# Patient Record
Sex: Female | Born: 1965 | Race: Black or African American | Hispanic: No | Marital: Married | Smoking: Never smoker
Health system: Southern US, Community
[De-identification: ages and names within clinical notes are randomized; demographics above are authoritative.]

## PROBLEM LIST (undated history)

## (undated) DIAGNOSIS — I10 Essential (primary) hypertension: Secondary | ICD-10-CM

## (undated) HISTORY — PX: BREAST EXCISIONAL BIOPSY: SUR124

---

## 1998-01-28 ENCOUNTER — Ambulatory Visit (HOSPITAL_COMMUNITY): Admission: RE | Admit: 1998-01-28 | Discharge: 1998-01-28 | Payer: Self-pay

## 1998-04-19 ENCOUNTER — Ambulatory Visit (HOSPITAL_COMMUNITY): Admission: RE | Admit: 1998-04-19 | Discharge: 1998-04-19 | Payer: Self-pay

## 1998-09-02 ENCOUNTER — Ambulatory Visit (HOSPITAL_COMMUNITY): Admission: RE | Admit: 1998-09-02 | Discharge: 1998-09-02 | Payer: Self-pay | Admitting: *Deleted

## 1999-07-29 ENCOUNTER — Other Ambulatory Visit: Admission: RE | Admit: 1999-07-29 | Discharge: 1999-07-29 | Payer: Self-pay | Admitting: Family Medicine

## 2001-01-03 ENCOUNTER — Other Ambulatory Visit: Admission: RE | Admit: 2001-01-03 | Discharge: 2001-01-03 | Payer: Self-pay | Admitting: Family Medicine

## 2002-01-03 ENCOUNTER — Inpatient Hospital Stay (HOSPITAL_COMMUNITY): Admission: AD | Admit: 2002-01-03 | Discharge: 2002-01-03 | Payer: Self-pay | Admitting: Obstetrics and Gynecology

## 2002-01-20 ENCOUNTER — Inpatient Hospital Stay (HOSPITAL_COMMUNITY): Admission: AD | Admit: 2002-01-20 | Discharge: 2002-01-23 | Payer: Self-pay | Admitting: Obstetrics and Gynecology

## 2002-01-24 ENCOUNTER — Encounter: Admission: RE | Admit: 2002-01-24 | Discharge: 2002-02-23 | Payer: Self-pay | Admitting: Obstetrics and Gynecology

## 2002-02-28 ENCOUNTER — Ambulatory Visit (HOSPITAL_COMMUNITY): Admission: RE | Admit: 2002-02-28 | Discharge: 2002-02-28 | Payer: Self-pay | Admitting: Obstetrics & Gynecology

## 2002-02-28 ENCOUNTER — Encounter (INDEPENDENT_AMBULATORY_CARE_PROVIDER_SITE_OTHER): Payer: Self-pay | Admitting: Specialist

## 2002-03-22 ENCOUNTER — Other Ambulatory Visit: Admission: RE | Admit: 2002-03-22 | Discharge: 2002-03-22 | Payer: Self-pay | Admitting: Obstetrics and Gynecology

## 2003-07-11 ENCOUNTER — Encounter: Admission: RE | Admit: 2003-07-11 | Discharge: 2003-07-11 | Payer: Self-pay | Admitting: Obstetrics and Gynecology

## 2005-09-15 ENCOUNTER — Encounter: Admission: RE | Admit: 2005-09-15 | Discharge: 2005-09-15 | Payer: Self-pay | Admitting: Obstetrics and Gynecology

## 2006-09-29 ENCOUNTER — Encounter: Admission: RE | Admit: 2006-09-29 | Discharge: 2006-09-29 | Payer: Self-pay | Admitting: Obstetrics and Gynecology

## 2007-10-19 ENCOUNTER — Encounter: Admission: RE | Admit: 2007-10-19 | Discharge: 2007-10-19 | Payer: Self-pay | Admitting: Obstetrics and Gynecology

## 2007-12-15 ENCOUNTER — Emergency Department (HOSPITAL_COMMUNITY): Admission: EM | Admit: 2007-12-15 | Discharge: 2007-12-15 | Payer: Self-pay | Admitting: Emergency Medicine

## 2008-11-13 ENCOUNTER — Encounter: Admission: RE | Admit: 2008-11-13 | Discharge: 2008-11-13 | Payer: Self-pay | Admitting: Obstetrics and Gynecology

## 2009-11-20 ENCOUNTER — Encounter: Admission: RE | Admit: 2009-11-20 | Discharge: 2009-11-20 | Payer: Self-pay | Admitting: Obstetrics and Gynecology

## 2010-07-28 ENCOUNTER — Encounter: Payer: Self-pay | Admitting: Obstetrics and Gynecology

## 2010-11-05 ENCOUNTER — Other Ambulatory Visit: Payer: Self-pay | Admitting: Obstetrics and Gynecology

## 2010-11-05 DIAGNOSIS — Z1231 Encounter for screening mammogram for malignant neoplasm of breast: Secondary | ICD-10-CM

## 2010-11-21 NOTE — H&P (Signed)
[facility]  Patient:    Joanne Reilly, Joanne Reilly Visit Number: [id number] MRN: [id number]          Service Type: OBS Location: 910B 9165 01 Attending Physician:  Lenoard Aden Dictated by:   Lenoard Aden, M.D. Admit Date:  01/20/2002                           History and Physical  CHIEF COMPLAINT:              Labor.  HISTORY OF PRESENT ILLNESS:   The patient is a 45 year old African American female G2, P0, EDD of February 02, 2002, at 38+ weeks with chronic hypertension in active labor.  ALLERGIES:                    No known drug allergies.  MEDICATIONS:                  Prenatal vitamins and Toprol XL.  PREGNANCY HISTORY:            Notable for ectopic pregnancy with questionable surgery to remove her tube.  History of fibroids per ultrasound.  History of IVF induced gestation.  History of breast surgery in 1999.  FAMILY HISTORY:               Hypertension.  PAST MEDICAL HISTORY:         Personal history of chronic hypertension.  PHYSICAL EXAMINATION: GENERAL:                      She is a well-developed, well-nourished [place] American female in no apparent distress.  HEENT:                        Normal.  LUNGS:                        Clear.  HEART:                        Regular rhythm.  ABDOMEN:                      Soft, gravid, nontender.  Estimated fetal weight by ultrasound is approximately 6-1/2 pounds.  PELVIC:                       Cervix is 6-7 cm, 100% vertex and 0 station.  EXTREMITIES:                  No cords.  NEUROLOGICAL:                 Exam is nonfocal.  IMPRESSION:                   1. Term intrauterine pregnancy with                                  chronic hypertension in active labor.                               2. Chronic hypertension, stable on Toprol XL.  3. Active labor.  PLAN:                         Proceed with attempted vaginal delivery.  Risks of anesthesia,  infection, bleeding, possible need for C-section discussed. Anticipate attempt at vaginal delivery. Dictated by:   Lenoard Aden, M.D. Attending Physician:  Lenoard Aden DD:  01/21/02 TD:  01/21/02 Job: [id number]

## 2010-11-21 NOTE — Op Note (Signed)
NAME:  Joanne Reilly, Joanne Reilly                       ACCOUNT NO.:  [id number]   MEDICAL RECORD NO.:  [id number]                   PATIENT TYPE:  AMB   LOCATION:  DFTL                                 FACILITY:  [facility]   PHYSICIAN:  Genia Del, M.D.             DATE OF BIRTH:  03/11/1966   DATE OF PROCEDURE:  02/28/2002  DATE OF DISCHARGE:                                 OPERATIVE REPORT   PREOPERATIVE DIAGNOSIS:  Retained products of conception prolapsing in the  vagina, six weeks postpartum.   POSTOPERATIVE DIAGNOSES:  1. Retained products of conception prolapsing in the vagina, six weeks     postpartum.  2. Probable accretio.   PROCEDURE:  Manual extraction of products of conception and curettage of  intrauterine cavity.   SURGEON:  Genia Del, M.D.   ANESTHESIOLOGIST:  Belva Agee, M.D.   PROCEDURE:  Under general anesthesia with endotracheal intubation, the  patient is in lithotomy position.  She was prepped with Hibiclens on the  suprapubic, vulvar, and vaginal areas and draped as usual.  The patient was  seen the same day in the office for her postpartum visit.  She had an exam  revealing a prolapsing mass from the endocervix protruding in the vagina.  The differential was a fibroid tumor versus products of conception.  A  biopsy had been done which confirmed products of conception.  So now, under  general anesthesia, a bimanual exam is done revealing placental tissue which  is firmly adherent to the lower anterior uterine segment.  This is carefully  extracted manually and sent to pathology.  On the anterior right aspect the  uterine wall feels thinner so attention is given not to go too deep in those  areas.  We then proceed with curettage of the intrauterine cavity with a  large sharp curette which brings mild amounts of placental tissue.  The  curettage is done systematically on intrauterine surfaces.  Although the  sensation is not of a completely regular  cavity since no significant pieces  of placenta is felt to remain, we stopped the procedure at this point in  order to avoid trauma to the uterus or even to the bladder anteriorly.  Hemostasis is adequate and the uterus is well contracted.  Products of  conception correspond to about 5 x 10 cm.  The instruments are all removed.  Estimated blood loss was around 200 cc.  No complications occurred and the  patient was transferred to the recovery room in good status.  She received  Cefotan at induction and she will continue on Augmentin 500 mg twice a day  for 10 days at home.                                                [place]  Seymour Bars, M.D.    ML/MEDQ  D:  02/28/2002  T:  02/28/2002  Job:  [id number]

## 2010-11-21 NOTE — Op Note (Signed)
[facility]  Patient:    Joanne Reilly, Joanne Reilly Visit Number: [id number] MRN: [id number]          Service Type: OBS Location: 910A 9133 01 Attending Physician:  Lenoard Aden Dictated by:   Lenoard Aden, M.D. Proc. Date: 01/21/02 Admit Date:  01/20/2002 Discharge Date: 01/23/2002                             Operative Report  INDICATIONS:                  Maternal exhaustion, persistent fetal tachycardia.  The patient has been pushing x2-1/2 hours.  POSTOPERATIVE DIAGNOSES:      Maternal exhaustion, persistent fetal tachycardia.  The patient has been pushing x2-1/2 hours, occiput posterior presentation, nuchal cord x1, body cord x2.  PROCEDURE:                    The patient and husband are apprised of the risks, benefits of proceeding with operative vaginal delivery.  Risks and benefits of vacuum neonatal and maternal complications noted.  The patient and husband desire to proceed.  The Mityvac mushroom cup applied to fetal vertex OP +3 station for atraumatic delivery x2 pulls and one popoff over a second degree laceration.  A full-term living female Apgars 8 and 9.  Placenta delivered spontaneously intact.  Three vessel cord noted.  No vaginal or cervical lacerations noted.  Second degree midline perineal laceration repaired using a 3-0 Vicryl Rapide.  Good hemostasis noted.  Estimated blood loss 500 cc.  Mother and baby tolerate procedure well.  Recovery room in good condition.  Please note, fetus was bulb suctioned and nuchal and body cords were reduced prior to delivery of the fetal trunk. Dictated by:   Lenoard Aden, M.D. Attending Physician:  Lenoard Aden DD:  01/21/02 TD:  01/25/02 Job: [id number]

## 2010-11-21 NOTE — H&P (Signed)
NAME:  Joanne Reilly, Joanne Reilly                       ACCOUNT NO.:  [id number]   MEDICAL RECORD NO.:  [id number]                   PATIENT TYPE:  AMB   LOCATION:  DFTL                                 FACILITY:  [facility]   PHYSICIAN:  Genia Del, M.D.             DATE OF BIRTH:  Nov 18, 1965   DATE OF ADMISSION:  02/28/2002  DATE OF DISCHARGE:                                HISTORY & PHYSICAL   CHIEF COMPLAINT:  Vaginal bleeding.   HISTORY OF PRESENT ILLNESS:  This is a 45 year old gravida 2, para 1-0-1-1,  married black female, who presented on February 28, 2002, for postpartum  visit, status post a vacuum delivery by Dr. Billy Coast on January 21, 2002, and who  is now being admitted for removal of a uterine/cervical mass found on  examination today at her visit.  The patient had complained of ongoing  vaginal bleeding with intermittent increasing in flow with some clots.  She  had no cramping.  She is bottle feeding.  She planned no contraception.  She  had had no problems with the delivery or the placenta as far as she knows.  Her history is notable for chronic hypertension.  Her pregnancy had been,  other than the hypertension, uncomplicated.  She had had several ultrasounds  during the pregnancy.  She is noted to have uterine fibroids, but none was  documented by ultrasound during her pregnancy, and her placenta initially  was complete previa, but became anterior-posterolateral without any issues  during the pregnancy as well.   PAST MEDICAL HISTORY:  1. Chronic hypertension.  2. Thalassemia trait.   ALLERGIES:  No known drug allergies.   MEDICATIONS:  1. Toprol XL.  2. Vitamins.   PAST SURGICAL HISTORY:  1. Breast surgery in 1999.  2. Ectopic pregnancy.   OBSTETRICAL HISTORY:  1. Vacuum extraction, January 21, 2002.  2. Ectopic pregnancy with this subsequent pregnancy as a result of in vitro     fertilization.   GYNECOLOGICAL HISTORY:  1. Fibroid uterus.  2. Ectopic  pregnancy.   FAMILY HISTORY:  Positive for hypertension.   SOCIAL HISTORY:  Married, nonsmoker, one child.   REVIEW OF SYSTEMS:  The patient complains of peripheral edema since the  delivery of her child.  She had no problems with edema, prepregnancy, or  during the pregnancy.  All other systems except as noted in the history of  present illness are negative.   PHYSICAL EXAMINATION:  GENERAL:  Well-developed, well-nourished black female  in no acute distress.  VITAL SIGNS:  Blood pressure 138/86, weight is 223-1/2 pounds.  SKIN:  Shows no lesions.  HEENT:  Pale pink conjunctivae, anicteric sclerae, oropharynx negative.  HEART:  Regular rate and rhythm without murmur.  LUNGS:  Clear to auscultation.  BREASTS:  Pedunculated, soft, nontender, no palpable mass.  ABDOMEN:  Soft, nontender, no palpable mass.  PELVIC:  Vulva showed no lesions.  Vagina had mucoid, bloody discharge.  Cervix with a  visible mass of the os, which was about 1+ cm dilated.  On  traction that mass prolapsed partially, but was not able to be removed in  the office with increased bleeding noted.  Uterus was anteverted to axilla,  nontender adnexa, no palpable mass.  On digital exam you can go  circumferentially around this mass.  A portion of it was sent to pathology  for [facility].  EXTREMITIES:  Had 1+ ankle edema bilaterally.   LABORATORY DATA:  Her hemoglobin was 10.4, her white count is 5.1, platelets  were 271,000.  [facility] pathology subsequently showed retained placental tissue.  Ultrasound had a 4.1 cm hypoechoic mass with calcification in the cervical  canal, question prolapsing fibroid with retained products.  Her uterus had a  1.8 cm anterior fibroid, and the uterus itself measured 10.7 x 6.6 x 7.3.  Endometrial lining was 0.7 cm.   IMPRESSION:  1. Prolapsing mass of the cervix, possible retained remnants of tissue     versus prolapse of fibroid.  2. Chronic hypertension, postpartum state.   PLAN:  1.  [facility]  2. Routine admission labs.  3. Dr. Seymour Bars, who is the on call physician, will be planning a D&C removal     of this mass, possible hysteroscopy.  Risks were reviewed and consent     will be obtained through Dr. Seymour Bars.  The patient has to return for Pap,     given the fact that she is bleeding at this time.     Joanne Reilly, M.D.               Genia Del, M.D.    SAC/MEDQ  D:  02/28/2002  T:  02/28/2002  Job:  [id number]

## 2010-11-24 ENCOUNTER — Ambulatory Visit
Admission: RE | Admit: 2010-11-24 | Discharge: 2010-11-24 | Disposition: A | Discharge status: Home / Self Care | Payer: 59 | Source: Ambulatory Visit | Attending: Obstetrics and Gynecology | Admitting: Obstetrics and Gynecology

## 2010-11-24 DIAGNOSIS — Z1231 Encounter for screening mammogram for malignant neoplasm of breast: Secondary | ICD-10-CM

## 2011-11-10 ENCOUNTER — Other Ambulatory Visit: Payer: Self-pay | Admitting: Obstetrics and Gynecology

## 2011-11-10 DIAGNOSIS — Z1231 Encounter for screening mammogram for malignant neoplasm of breast: Secondary | ICD-10-CM

## 2011-12-02 ENCOUNTER — Ambulatory Visit
Admission: RE | Admit: 2011-12-02 | Discharge: 2011-12-02 | Disposition: A | Discharge status: Home / Self Care | Payer: 59 | Source: Ambulatory Visit | Attending: Obstetrics and Gynecology | Admitting: Obstetrics and Gynecology

## 2011-12-02 DIAGNOSIS — Z1231 Encounter for screening mammogram for malignant neoplasm of breast: Secondary | ICD-10-CM

## 2012-12-14 ENCOUNTER — Other Ambulatory Visit: Payer: Self-pay

## 2012-12-14 DIAGNOSIS — Z1231 Encounter for screening mammogram for malignant neoplasm of breast: Secondary | ICD-10-CM

## 2013-01-12 ENCOUNTER — Ambulatory Visit: Payer: 59

## 2013-01-26 ENCOUNTER — Ambulatory Visit
Admission: RE | Admit: 2013-01-26 | Discharge: 2013-01-26 | Disposition: A | Discharge status: Home / Self Care | Payer: 59 | Source: Ambulatory Visit

## 2013-01-26 DIAGNOSIS — Z1231 Encounter for screening mammogram for malignant neoplasm of breast: Secondary | ICD-10-CM

## 2013-01-30 ENCOUNTER — Other Ambulatory Visit: Payer: Self-pay | Admitting: Obstetrics and Gynecology

## 2013-01-30 DIAGNOSIS — R928 Other abnormal and inconclusive findings on diagnostic imaging of breast: Secondary | ICD-10-CM

## 2013-02-09 ENCOUNTER — Inpatient Hospital Stay: Admission: RE | Admit: 2013-02-09 | Payer: 59 | Source: Ambulatory Visit

## 2013-02-09 ENCOUNTER — Ambulatory Visit
Admission: RE | Admit: 2013-02-09 | Discharge: 2013-02-09 | Disposition: A | Discharge status: Home / Self Care | Payer: 59 | Source: Ambulatory Visit | Attending: Family Medicine | Admitting: Family Medicine

## 2013-02-09 ENCOUNTER — Other Ambulatory Visit: Payer: Self-pay | Admitting: Family Medicine

## 2013-02-09 DIAGNOSIS — R928 Other abnormal and inconclusive findings on diagnostic imaging of breast: Secondary | ICD-10-CM

## 2013-07-21 ENCOUNTER — Other Ambulatory Visit: Payer: Self-pay | Admitting: Family Medicine

## 2013-07-21 DIAGNOSIS — R921 Mammographic calcification found on diagnostic imaging of breast: Secondary | ICD-10-CM

## 2013-08-14 ENCOUNTER — Ambulatory Visit
Admission: RE | Admit: 2013-08-14 | Discharge: 2013-08-14 | Disposition: A | Discharge status: Home / Self Care | Payer: Self-pay | Source: Ambulatory Visit | Attending: Family Medicine | Admitting: Family Medicine

## 2013-08-14 DIAGNOSIS — R921 Mammographic calcification found on diagnostic imaging of breast: Secondary | ICD-10-CM

## 2014-02-23 ENCOUNTER — Other Ambulatory Visit: Payer: Self-pay | Admitting: Obstetrics and Gynecology

## 2014-02-23 DIAGNOSIS — R921 Mammographic calcification found on diagnostic imaging of breast: Secondary | ICD-10-CM

## 2014-03-01 ENCOUNTER — Encounter (INDEPENDENT_AMBULATORY_CARE_PROVIDER_SITE_OTHER): Payer: Self-pay

## 2014-03-01 ENCOUNTER — Ambulatory Visit
Admission: RE | Admit: 2014-03-01 | Discharge: 2014-03-01 | Disposition: A | Discharge status: Home / Self Care | Payer: 59 | Source: Ambulatory Visit | Attending: Obstetrics and Gynecology | Admitting: Obstetrics and Gynecology

## 2014-03-01 DIAGNOSIS — R921 Mammographic calcification found on diagnostic imaging of breast: Secondary | ICD-10-CM

## 2015-02-04 ENCOUNTER — Other Ambulatory Visit: Payer: Self-pay | Admitting: Obstetrics and Gynecology

## 2015-02-04 DIAGNOSIS — R921 Mammographic calcification found on diagnostic imaging of breast: Secondary | ICD-10-CM

## 2015-03-22 ENCOUNTER — Ambulatory Visit
Admission: RE | Admit: 2015-03-22 | Discharge: 2015-03-22 | Disposition: A | Discharge status: Home / Self Care | Payer: Commercial Managed Care - HMO | Source: Ambulatory Visit | Attending: Obstetrics and Gynecology | Admitting: Obstetrics and Gynecology

## 2015-03-22 DIAGNOSIS — R921 Mammographic calcification found on diagnostic imaging of breast: Secondary | ICD-10-CM

## 2016-05-13 ENCOUNTER — Other Ambulatory Visit: Payer: Self-pay | Admitting: Obstetrics and Gynecology

## 2016-05-13 DIAGNOSIS — Z1231 Encounter for screening mammogram for malignant neoplasm of breast: Secondary | ICD-10-CM

## 2016-05-14 ENCOUNTER — Ambulatory Visit: Payer: Commercial Managed Care - HMO

## 2016-05-19 ENCOUNTER — Ambulatory Visit
Admission: RE | Admit: 2016-05-19 | Discharge: 2016-05-19 | Disposition: A | Discharge status: Home / Self Care | Payer: Commercial Managed Care - HMO | Source: Ambulatory Visit | Attending: Obstetrics and Gynecology | Admitting: Obstetrics and Gynecology

## 2016-05-19 DIAGNOSIS — Z1231 Encounter for screening mammogram for malignant neoplasm of breast: Secondary | ICD-10-CM

## 2017-01-27 DIAGNOSIS — Z01419 Encounter for gynecological examination (general) (routine) without abnormal findings: Secondary | ICD-10-CM | POA: Diagnosis not present

## 2017-04-19 DIAGNOSIS — Z13220 Encounter for screening for lipoid disorders: Secondary | ICD-10-CM | POA: Diagnosis not present

## 2017-04-19 DIAGNOSIS — Z0000 Encounter for general adult medical examination without abnormal findings: Secondary | ICD-10-CM | POA: Diagnosis not present

## 2017-06-10 ENCOUNTER — Other Ambulatory Visit: Payer: Self-pay | Admitting: Obstetrics and Gynecology

## 2017-06-10 DIAGNOSIS — Z1231 Encounter for screening mammogram for malignant neoplasm of breast: Secondary | ICD-10-CM

## 2017-07-08 ENCOUNTER — Ambulatory Visit
Admission: RE | Admit: 2017-07-08 | Discharge: 2017-07-08 | Disposition: A | Discharge status: Home / Self Care | Payer: 59 | Source: Ambulatory Visit | Attending: Obstetrics and Gynecology | Admitting: Obstetrics and Gynecology

## 2017-07-08 DIAGNOSIS — Z1231 Encounter for screening mammogram for malignant neoplasm of breast: Secondary | ICD-10-CM

## 2018-03-21 DIAGNOSIS — H04123 Dry eye syndrome of bilateral lacrimal glands: Secondary | ICD-10-CM | POA: Diagnosis not present

## 2018-03-21 DIAGNOSIS — H40033 Anatomical narrow angle, bilateral: Secondary | ICD-10-CM | POA: Diagnosis not present

## 2018-07-05 ENCOUNTER — Other Ambulatory Visit: Payer: Self-pay | Admitting: Obstetrics and Gynecology

## 2018-07-05 DIAGNOSIS — Z1231 Encounter for screening mammogram for malignant neoplasm of breast: Secondary | ICD-10-CM

## 2018-08-04 ENCOUNTER — Ambulatory Visit
Admission: RE | Admit: 2018-08-04 | Discharge: 2018-08-04 | Disposition: A | Discharge status: Home / Self Care | Payer: 59 | Source: Ambulatory Visit | Attending: Obstetrics and Gynecology | Admitting: Obstetrics and Gynecology

## 2018-08-04 ENCOUNTER — Other Ambulatory Visit: Payer: Self-pay | Admitting: Obstetrics and Gynecology

## 2018-08-04 DIAGNOSIS — Z1231 Encounter for screening mammogram for malignant neoplasm of breast: Secondary | ICD-10-CM | POA: Diagnosis not present

## 2018-09-06 DIAGNOSIS — Z6832 Body mass index (BMI) 32.0-32.9, adult: Secondary | ICD-10-CM | POA: Diagnosis not present

## 2018-09-06 DIAGNOSIS — Z01419 Encounter for gynecological examination (general) (routine) without abnormal findings: Secondary | ICD-10-CM | POA: Diagnosis not present

## 2018-09-09 DIAGNOSIS — E669 Obesity, unspecified: Secondary | ICD-10-CM | POA: Diagnosis not present

## 2018-09-09 DIAGNOSIS — Z6832 Body mass index (BMI) 32.0-32.9, adult: Secondary | ICD-10-CM | POA: Diagnosis not present

## 2018-09-14 DIAGNOSIS — Z0000 Encounter for general adult medical examination without abnormal findings: Secondary | ICD-10-CM | POA: Diagnosis not present

## 2018-09-14 DIAGNOSIS — Z13220 Encounter for screening for lipoid disorders: Secondary | ICD-10-CM | POA: Diagnosis not present

## 2018-10-17 DIAGNOSIS — Z6829 Body mass index (BMI) 29.0-29.9, adult: Secondary | ICD-10-CM | POA: Diagnosis not present

## 2018-10-17 DIAGNOSIS — E669 Obesity, unspecified: Secondary | ICD-10-CM | POA: Diagnosis not present

## 2019-05-15 ENCOUNTER — Other Ambulatory Visit: Payer: Self-pay

## 2019-05-15 DIAGNOSIS — Z20822 Contact with and (suspected) exposure to covid-19: Secondary | ICD-10-CM

## 2019-05-17 LAB — NOVEL CORONAVIRUS, NAA
SARS-CoV-2, NAA: NOT DETECTED
SARS-CoV-2, NAA: NOT DETECTED

## 2019-08-16 ENCOUNTER — Other Ambulatory Visit: Payer: Self-pay | Admitting: Obstetrics and Gynecology

## 2019-08-16 DIAGNOSIS — Z1231 Encounter for screening mammogram for malignant neoplasm of breast: Secondary | ICD-10-CM

## 2019-08-22 ENCOUNTER — Other Ambulatory Visit: Payer: Self-pay

## 2019-08-22 ENCOUNTER — Ambulatory Visit
Admission: RE | Admit: 2019-08-22 | Discharge: 2019-08-22 | Disposition: A | Discharge status: Home / Self Care | Payer: 59 | Source: Ambulatory Visit | Attending: Obstetrics and Gynecology | Admitting: Obstetrics and Gynecology

## 2019-08-22 DIAGNOSIS — Z1231 Encounter for screening mammogram for malignant neoplasm of breast: Secondary | ICD-10-CM

## 2020-08-07 ENCOUNTER — Other Ambulatory Visit: Payer: Self-pay | Admitting: Obstetrics and Gynecology

## 2020-08-07 DIAGNOSIS — Z1231 Encounter for screening mammogram for malignant neoplasm of breast: Secondary | ICD-10-CM

## 2020-09-06 ENCOUNTER — Inpatient Hospital Stay: Admission: RE | Admit: 2020-09-06 | Payer: 59 | Source: Ambulatory Visit

## 2020-09-06 DIAGNOSIS — Z1231 Encounter for screening mammogram for malignant neoplasm of breast: Secondary | ICD-10-CM

## 2020-09-16 ENCOUNTER — Other Ambulatory Visit: Payer: Self-pay | Admitting: Obstetrics and Gynecology

## 2020-09-16 DIAGNOSIS — Z1231 Encounter for screening mammogram for malignant neoplasm of breast: Secondary | ICD-10-CM

## 2020-11-01 ENCOUNTER — Other Ambulatory Visit: Payer: Self-pay

## 2020-11-01 ENCOUNTER — Ambulatory Visit
Admission: RE | Admit: 2020-11-01 | Discharge: 2020-11-01 | Disposition: A | Discharge status: Home / Self Care | Payer: 59 | Source: Ambulatory Visit

## 2020-11-01 DIAGNOSIS — Z1231 Encounter for screening mammogram for malignant neoplasm of breast: Secondary | ICD-10-CM

## 2021-01-15 ENCOUNTER — Other Ambulatory Visit: Payer: Self-pay

## 2021-01-15 ENCOUNTER — Ambulatory Visit: Payer: 59 | Admitting: Podiatry

## 2021-01-15 ENCOUNTER — Ambulatory Visit (INDEPENDENT_AMBULATORY_CARE_PROVIDER_SITE_OTHER): Payer: 59

## 2021-01-15 DIAGNOSIS — M79672 Pain in left foot: Secondary | ICD-10-CM

## 2021-01-15 DIAGNOSIS — M722 Plantar fascial fibromatosis: Secondary | ICD-10-CM | POA: Diagnosis not present

## 2021-01-15 DIAGNOSIS — M79671 Pain in right foot: Secondary | ICD-10-CM | POA: Diagnosis not present

## 2021-01-15 MED ORDER — BETAMETHASONE SOD PHOS & ACET 6 (3-3) MG/ML IJ SUSP
3.0000 mg | Freq: Once | INTRAMUSCULAR | Status: AC
  Administered 2021-01-15: 3 mg via INTRA_ARTICULAR

## 2021-01-15 MED ORDER — MELOXICAM 15 MG PO TABS: 15.0000 mg | ORAL_TABLET | Freq: Every day | ORAL | 1 refills | Status: DC

## 2021-01-15 NOTE — Progress Notes (Signed)
   Subjective: 55 y.o. female presenting as a new patient for evaluation of bilateral heel pain is been going on for approximately 1-2 years now.  She continues to have pain and tenderness in her heels.  She denies a history of injury.  She has not anything for treatment.  She presents for further treatment and evaluation   No past medical history on file.   Objective: Physical Exam General: The patient is alert and oriented x3 in no acute distress.  Dermatology: Skin is warm, dry and supple bilateral lower extremities. Negative for open lesions or macerations bilateral.   Vascular: Dorsalis Pedis and Posterior Tibial pulses palpable bilateral.  Capillary fill time is immediate to all digits.  Neurological: Epicritic and protective threshold intact bilateral.   Musculoskeletal: Tenderness to palpation to the plantar aspect of the bilateral heels along the plantar fascia. All other joints range of motion within normal limits bilateral. Strength 5/5 in all groups bilateral.   Radiographic exam: Normal osseous mineralization. Joint spaces preserved. No fracture/dislocation/boney destruction. No other soft tissue abnormalities or radiopaque foreign bodies.   Assessment: 1. plantar fasciitis bilateral feet  Plan of Care:  1. Patient evaluated. Xrays reviewed.   2. Injection of 0.5cc Celestone soluspan injected into the bilateral heels.  3. Patient declined medrol dosepak 4. Rx for Meloxicam ordered for patient. 5. OTC powerstep insoles provided 6. Instructed patient regarding therapies and modalities at home to alleviate symptoms.  7. Return to clinic in 4 weeks.    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 Felecia Shelling, DPM    [address] [place], [place] [address]                Office [phone]  Fax [phone]

## 2021-02-12 ENCOUNTER — Ambulatory Visit: Payer: 59 | Admitting: Podiatry

## 2021-03-08 ENCOUNTER — Other Ambulatory Visit: Payer: Self-pay | Admitting: Podiatry

## 2021-03-10 NOTE — Telephone Encounter (Signed)
Please advise 

## 2021-05-13 ENCOUNTER — Other Ambulatory Visit: Payer: Self-pay | Admitting: Podiatry

## 2021-07-15 ENCOUNTER — Other Ambulatory Visit: Payer: Self-pay | Admitting: Podiatry

## 2021-09-03 ENCOUNTER — Other Ambulatory Visit: Payer: Self-pay | Admitting: Podiatry

## 2021-09-19 ENCOUNTER — Other Ambulatory Visit: Payer: Self-pay | Admitting: Obstetrics and Gynecology

## 2021-09-19 DIAGNOSIS — Z1231 Encounter for screening mammogram for malignant neoplasm of breast: Secondary | ICD-10-CM

## 2021-10-14 DIAGNOSIS — Z1231 Encounter for screening mammogram for malignant neoplasm of breast: Secondary | ICD-10-CM

## 2021-11-04 ENCOUNTER — Other Ambulatory Visit: Payer: Self-pay | Admitting: Obstetrics and Gynecology

## 2021-11-04 DIAGNOSIS — Z1231 Encounter for screening mammogram for malignant neoplasm of breast: Secondary | ICD-10-CM

## 2021-11-10 ENCOUNTER — Ambulatory Visit
Admission: RE | Admit: 2021-11-10 | Discharge: 2021-11-10 | Disposition: A | Discharge status: Home / Self Care | Payer: 59 | Source: Ambulatory Visit

## 2021-11-10 DIAGNOSIS — Z1231 Encounter for screening mammogram for malignant neoplasm of breast: Secondary | ICD-10-CM

## 2021-11-26 ENCOUNTER — Other Ambulatory Visit: Payer: Self-pay | Admitting: Podiatry

## 2022-01-22 ENCOUNTER — Other Ambulatory Visit: Payer: Self-pay | Admitting: Podiatry

## 2022-08-02 IMAGING — MG MM DIGITAL SCREENING BILAT W/ TOMO AND CAD
6 of 10 series · 6 of 30 positions shown · non-contrast
Comparison: Previous exam(s).

CLINICAL DATA: Screening.

EXAM:
DIGITAL SCREENING BILATERAL MAMMOGRAM WITH TOMOSYNTHESIS AND CAD
TECHNIQUE: Bilateral screening digital craniocaudal and mediolateral oblique
mammograms were obtained. Bilateral screening digital breast
tomosynthesis was performed. The images were evaluated with
computer-aided detection.

[L CC synth-2D]
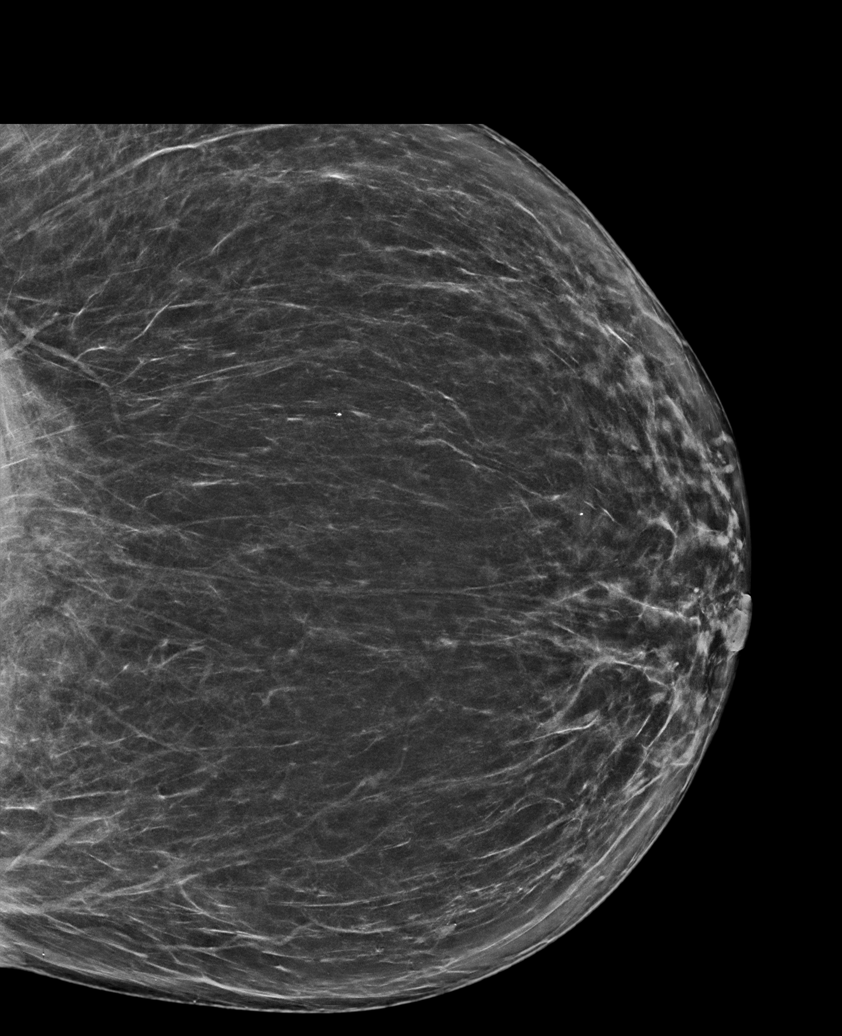

[L MLO synth-2D]
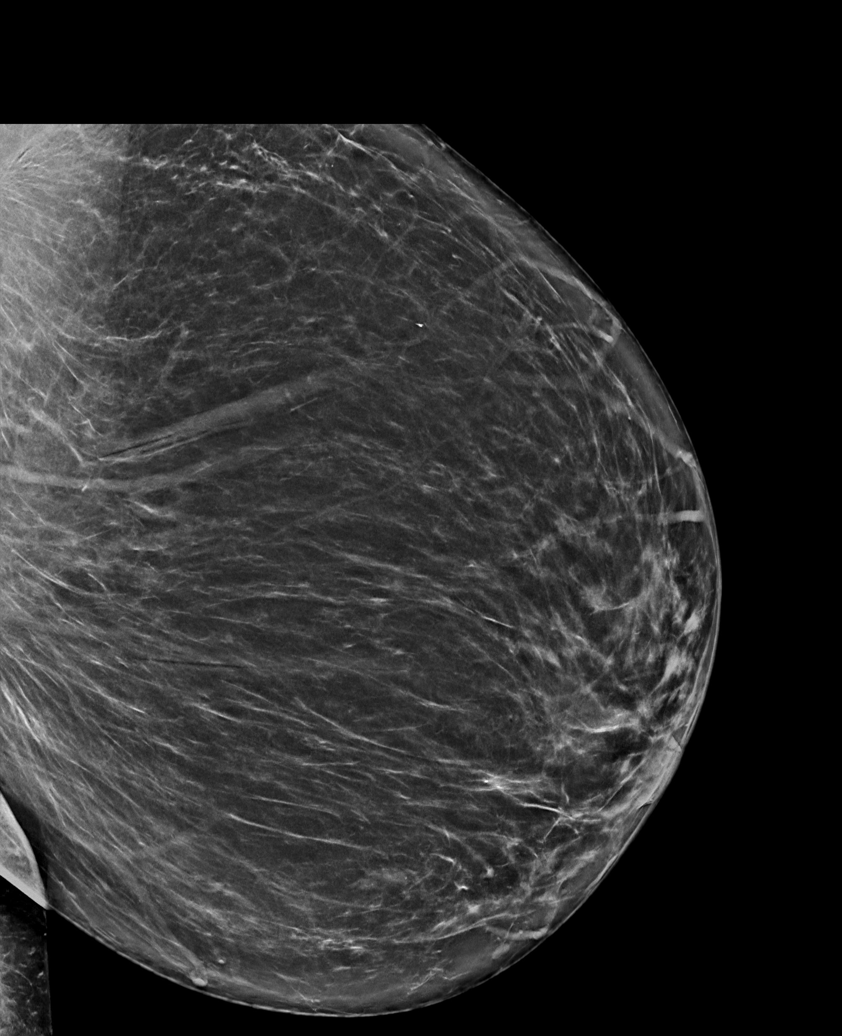

[R CC synth-2D (1 of 2)]
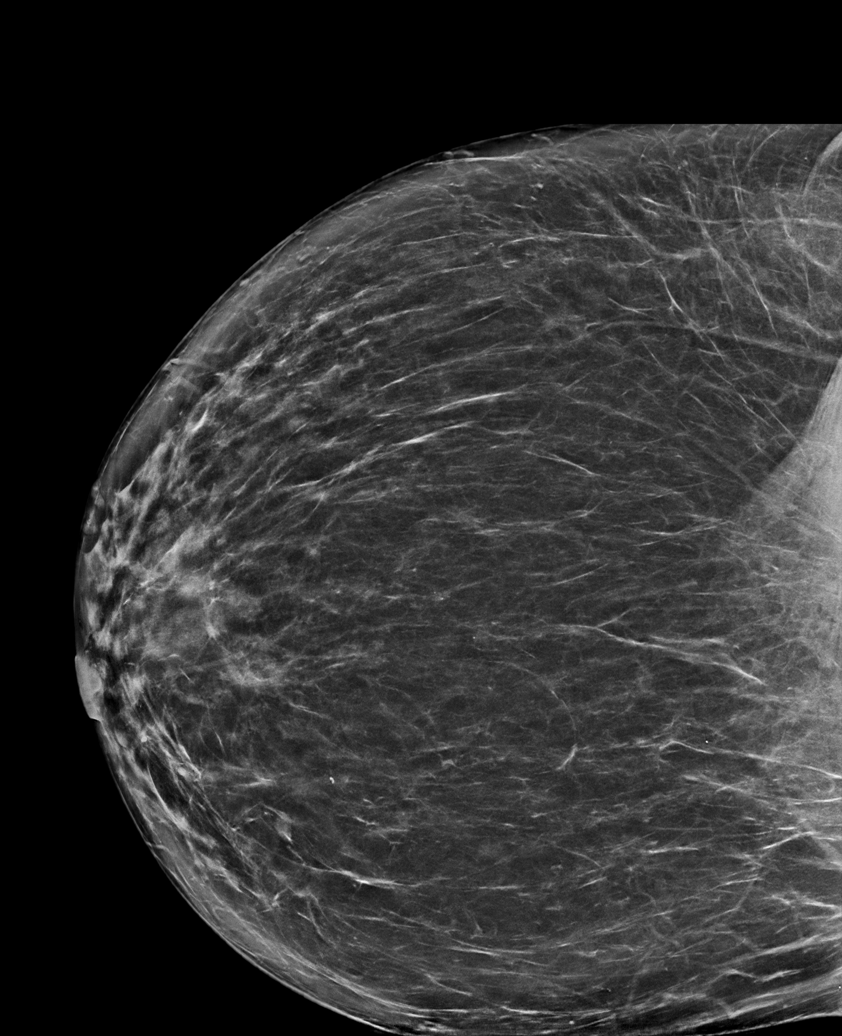

[R CC synth-2D (2 of 2)]
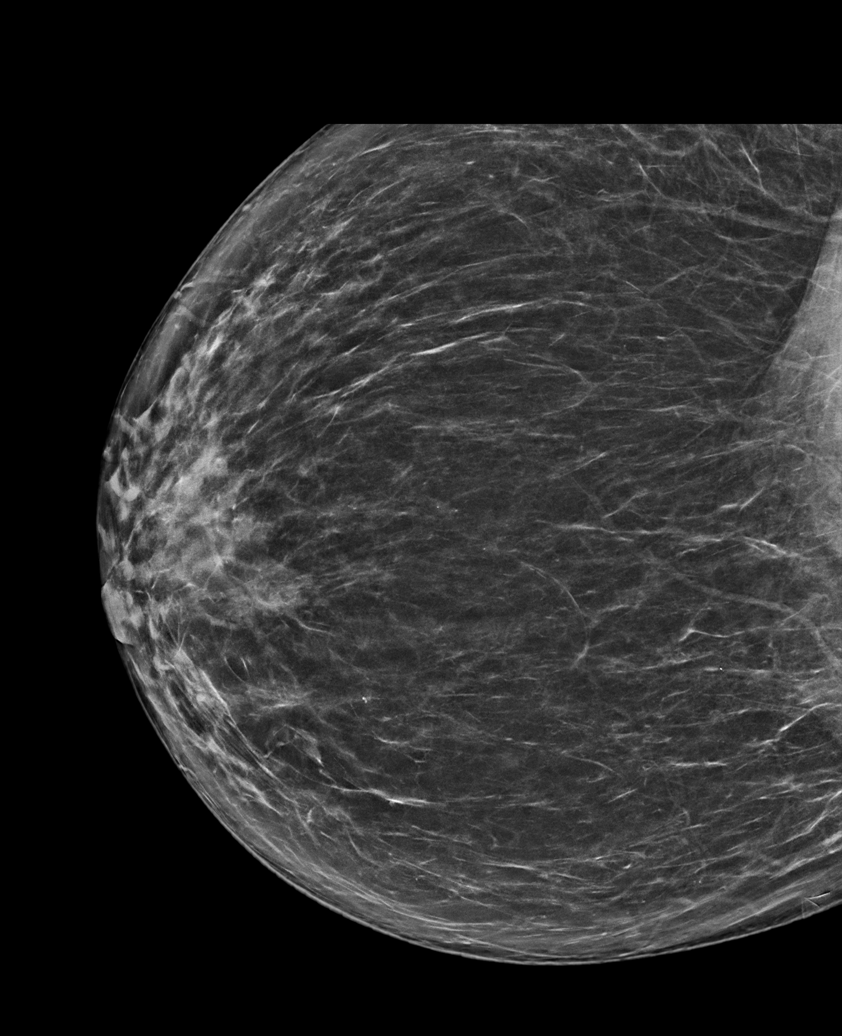

[R MLO synth-2D]
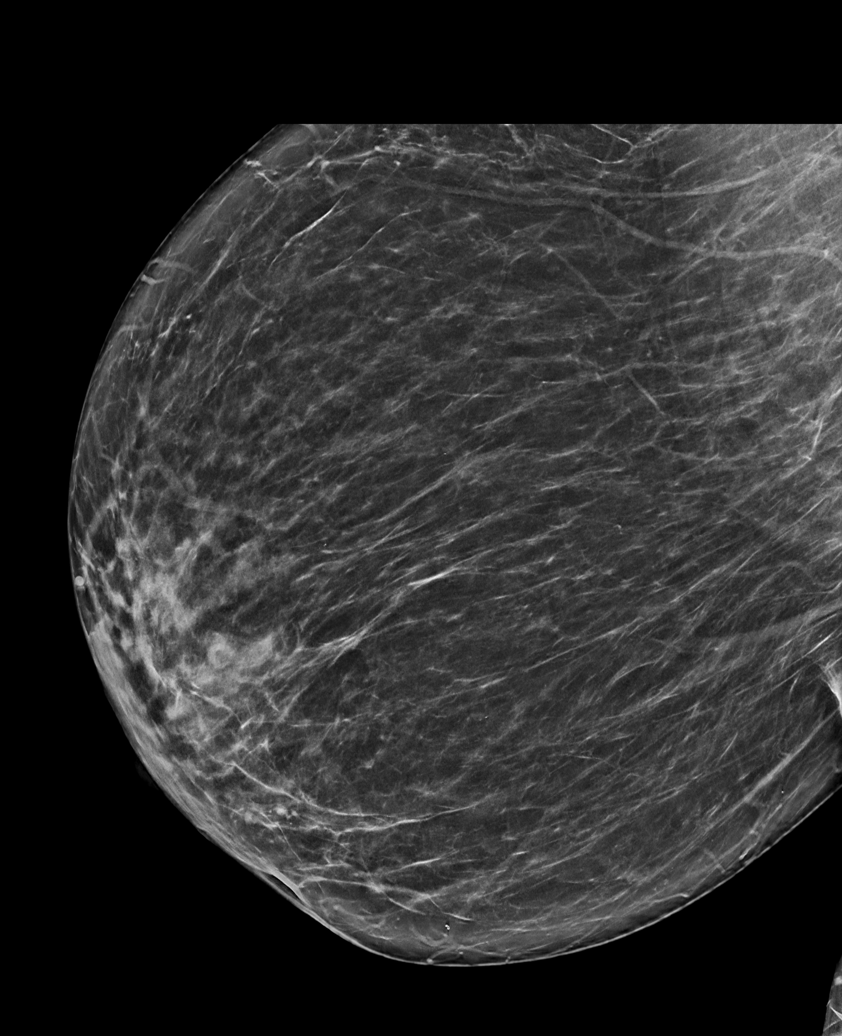

[R CC tomo · tomo slice 43/86.0]
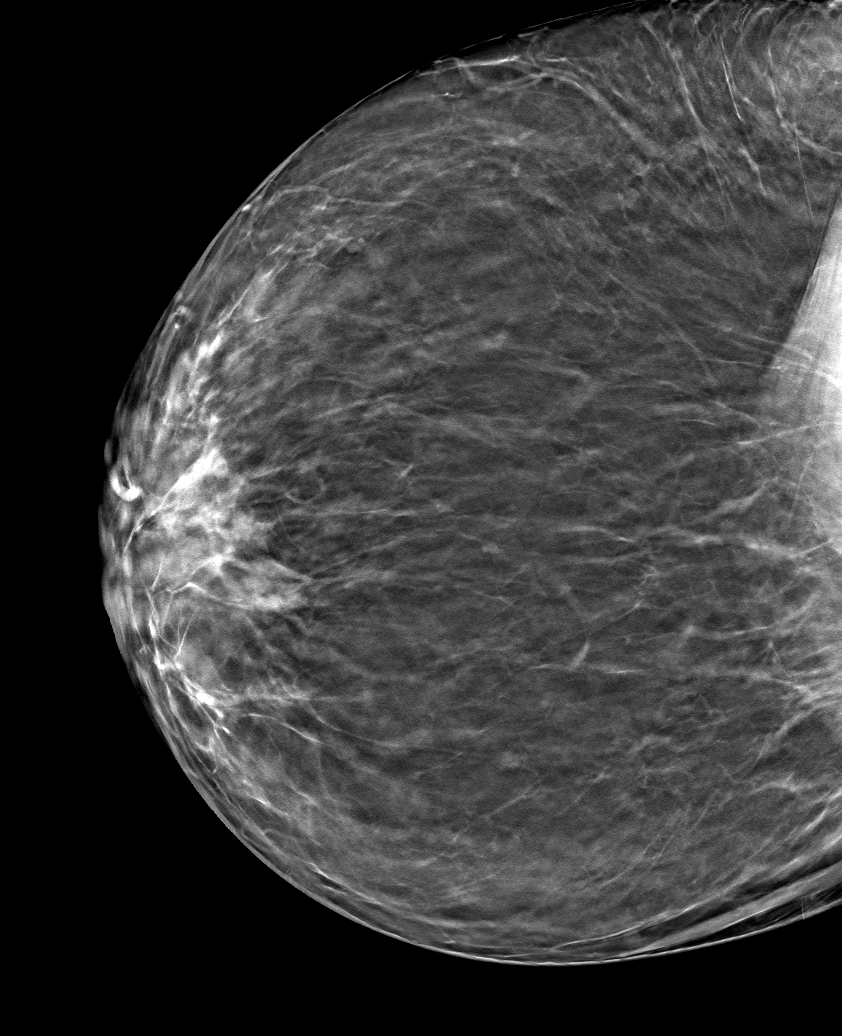

[6 of 30 positions shown; findings below may reference images not displayed]

ACR Breast Density Category b: There are scattered areas of
fibroglandular density.
FINDINGS: There are no findings suspicious for malignancy.
IMPRESSION: No mammographic evidence of malignancy. A result letter of this
screening mammogram will be mailed directly to the patient.

RECOMMENDATION:
Screening mammogram in one year. (Code:51-O-LD2)

BI-RADS CATEGORY  1: Negative.

## 2022-10-16 ENCOUNTER — Ambulatory Visit
Admission: RE | Admit: 2022-10-16 | Discharge: 2022-10-16 | Disposition: A | Discharge status: Home / Self Care | Payer: 59 | Source: Ambulatory Visit

## 2022-10-16 VITALS — BP 127/88 | HR 84 | Temp 98.0°F | Resp 18

## 2022-10-16 DIAGNOSIS — L2082 Flexural eczema: Secondary | ICD-10-CM

## 2022-10-16 HISTORY — DX: Essential (primary) hypertension: I10

## 2022-10-16 MED ORDER — BETAMETHASONE DIPROPIONATE 0.05 % EX OINT: TOPICAL_OINTMENT | Freq: Two times a day (BID) | CUTANEOUS | 1 refills | Status: AC

## 2022-10-16 NOTE — ED Triage Notes (Signed)
Pt c/o burning pain and discoloration to posterior lower leg x ~2 years-states she was seen PCP and dermatology x 2 with biopsies taken-dx eczema-NAD-steady gait

## 2022-10-16 NOTE — ED Provider Notes (Signed)
[facility]  Note:  This document was prepared using Conservation officer, historic buildings and may include unintentional dictation errors.  MRN: [id number] DOB: 1965-11-25  Subjective:   Joanne Reilly is a 57 y.o. female presenting for 4 month history of worsening rash to the back of her right leg/knee.  Symptoms have gone on for 2 years.  She did see a dermatologist and had multiple biopsies taken, it was confirmed to have eczema.  She was prescribed a steroid cream but has not really used it much.  No active redness, drainage of pus or bleeding.  Patient does get intermittent stinging sensations and redness but mostly has dry dark-colored skin the skin over the area.  No current facility-administered medications for this encounter.  Current Outpatient Medications:    losartan-hydrochlorothiazide (HYZAAR) 100-25 MG tablet, Take 1 tablet by mouth daily., Disp: , Rfl:    meloxicam (MOBIC) 15 MG tablet, TAKE 1 TABLET (15 MG TOTAL) BY MOUTH DAILY., Disp: 30 tablet, Rfl: 1   No Known Allergies  Past Medical History:  Diagnosis Date   Hypertension      Past Surgical History:  Procedure Laterality Date   BREAST EXCISIONAL BIOPSY Left    BREAST EXCISIONAL BIOPSY Right     Family History  Problem Relation Age of Onset   Breast cancer Neg Hx     Social History   Tobacco Use   Smoking status: Never   Smokeless tobacco: Never  Vaping Use   Vaping Use: Never used  Substance Use Topics   Alcohol use: Not Currently   Drug use: Never    ROS   Objective:   Vitals: BP 127/88 (BP Location: Right Arm)   Pulse 84   Temp 98 F (36.7 C) (Oral)   Resp 18   LMP 01/16/2013   SpO2 97%   Physical Exam Constitutional:      General: She is not in acute distress.    Appearance: Normal appearance. She is well-developed. She is not ill-appearing, toxic-appearing or diaphoretic.  HENT:     Head: Normocephalic and atraumatic.     Nose: Nose normal.      Mouth/Throat:     Mouth: Mucous membranes are moist.  Eyes:     General: No scleral icterus.       Right eye: No discharge.        Left eye: No discharge.     Extraocular Movements: Extraocular movements intact.  Cardiovascular:     Rate and Rhythm: Normal rate.  Pulmonary:     Effort: Pulmonary effort is normal.  Musculoskeletal:     Lumbar back: No swelling, edema, deformity, signs of trauma, lacerations, spasms, tenderness or bony tenderness. Normal range of motion. Negative right straight leg raise test and negative left straight leg raise test. No scoliosis.  Skin:    General: Skin is warm and dry.       Neurological:     General: No focal deficit present.     Mental Status: She is alert and oriented to person, place, and time.  Psychiatric:        Mood and Affect: Mood normal.        Behavior: Behavior normal.     Assessment and Plan :   PDMP not reviewed this encounter.  1. Flexural eczema     Patient declined an oral steroid course and I am in agreement.  Recommended the use of betamethasone ointment.  Discussed appropriate use of topical steroids.  Follow-up with dermatology. Counseled patient on potential for adverse effects with medications prescribed/recommended today, ER and return-to-clinic precautions discussed, patient verbalized understanding.    Wallis Bamberg, PA-C 10/16/22 1404

## 2022-10-27 ENCOUNTER — Other Ambulatory Visit: Payer: Self-pay | Admitting: Obstetrics and Gynecology

## 2022-10-27 DIAGNOSIS — Z1231 Encounter for screening mammogram for malignant neoplasm of breast: Secondary | ICD-10-CM

## 2022-11-18 ENCOUNTER — Ambulatory Visit
Admission: RE | Admit: 2022-11-18 | Discharge: 2022-11-18 | Disposition: A | Discharge status: Home / Self Care | Payer: 59 | Source: Ambulatory Visit

## 2022-11-18 DIAGNOSIS — Z1231 Encounter for screening mammogram for malignant neoplasm of breast: Secondary | ICD-10-CM

## 2022-12-21 ENCOUNTER — Ambulatory Visit (INDEPENDENT_AMBULATORY_CARE_PROVIDER_SITE_OTHER): Payer: 59 | Admitting: Podiatry

## 2022-12-21 ENCOUNTER — Ambulatory Visit (INDEPENDENT_AMBULATORY_CARE_PROVIDER_SITE_OTHER): Payer: 59

## 2022-12-21 ENCOUNTER — Encounter: Payer: Self-pay | Admitting: Podiatry

## 2022-12-21 DIAGNOSIS — M2011 Hallux valgus (acquired), right foot: Secondary | ICD-10-CM | POA: Diagnosis not present

## 2022-12-21 DIAGNOSIS — M2012 Hallux valgus (acquired), left foot: Secondary | ICD-10-CM

## 2022-12-21 NOTE — Progress Notes (Signed)
   Chief Complaint  Patient presents with   Foot Pain    "I have a bunion on both feet." N - bunions painful L - bilateral D - 2 years O - off and on C - throb A - certain shoes T - none    Subjective: 57 y.o. female presents today for evaluation of symptomatic bunions to the bilateral feet.  Patient states that her bunions are progressively getting worse especially over the last few years.  She tries to wear different shoes that are more loose fitting in the forefoot but they seem to rub.  She has also tried splinting the bunion which has not demonstrated any improvement or relief of her symptoms.  She was referred here from her PCP.  Past Medical History:  Diagnosis Date   Hypertension     Past Surgical History:  Procedure Laterality Date   BREAST EXCISIONAL BIOPSY Left    BREAST EXCISIONAL BIOPSY Right     No Known Allergies   Objective: Physical Exam General: The patient is alert and oriented x3 in no acute distress.  Dermatology: Skin is cool, dry and supple bilateral lower extremities. Negative for open lesions or macerations.  Vascular: Palpable pedal pulses bilaterally. No edema or erythema noted. Capillary refill within normal limits.  Neurological: Epicritic and protective threshold grossly intact bilaterally.   Musculoskeletal Exam: Clinical evidence of bunion deformity noted to the respective foot. There is moderate pain on palpation range of motion of the first MPJ. Lateral deviation of the hallux noted consistent with hallux abductovalgus.  Radiographic Exam B/L feet 12/21/2022: Normal osseous mineralization.  No acute fractures identified.  Increased intermetatarsal angle greater than 15 with a hallux abductus angle greater than 30 noted on AP view.  Metatarsus adductus with medial deviation of the lesser metatarsals also noted on AP view bilateral  Assessment: 1.  Hallux valgus bilateral 2.  Metatarsus adductus bilateral  -Patient evaluated.  X-rays  reviewed -Today we had a discussion specifically regarding bunion surgery.  Different surgical approaches were explained to the patient including Lapidus type bunionectomy versus Austin.  We also discussed the metatarsus adductus contributing to the forefoot pathology.  I do not recommend addressing metatarsus adductus due to the risks and benefits and potential complications and the patient agrees. -After discussing with the patient I do believe she would benefit from a simple long-arm Austin type bunionectomy.  Postoperative recovery course and procedure in detail were explained.  She would like to have the procedure within the next 1.5 years. -Return to clinic for surgical consult.  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 Felecia Shelling, DPM    [address] [place], [place] [address]                Office [phone]  Fax [phone]

## 2023-10-25 ENCOUNTER — Other Ambulatory Visit: Payer: Self-pay | Admitting: Obstetrics and Gynecology

## 2023-10-25 DIAGNOSIS — Z0000 Encounter for general adult medical examination without abnormal findings: Secondary | ICD-10-CM

## 2023-11-23 ENCOUNTER — Ambulatory Visit
Admission: RE | Admit: 2023-11-23 | Discharge: 2023-11-23 | Disposition: A | Discharge status: Home / Self Care | Payer: Self-pay | Source: Ambulatory Visit | Attending: Internal Medicine | Admitting: Internal Medicine

## 2023-11-23 DIAGNOSIS — Z0000 Encounter for general adult medical examination without abnormal findings: Secondary | ICD-10-CM
